# Patient Record
Sex: Female | Born: 1980 | Race: White | Hispanic: No | Marital: Single | Smoking: Former smoker
Health system: Southern US, Community
[De-identification: ages and names within clinical notes are randomized; demographics above are authoritative.]

## PROBLEM LIST (undated history)

## (undated) DIAGNOSIS — I10 Essential (primary) hypertension: Secondary | ICD-10-CM

## (undated) HISTORY — PX: OTHER SURGICAL HISTORY: SHX169

---

## 2014-10-25 ENCOUNTER — Emergency Department
Admission: EM | Admit: 2014-10-25 | Discharge: 2014-10-25 | Disposition: A | Discharge status: Home / Self Care | Payer: Self-pay | Source: Home / Self Care | Attending: Emergency Medicine | Admitting: Emergency Medicine

## 2014-10-25 DIAGNOSIS — J4521 Mild intermittent asthma with (acute) exacerbation: Secondary | ICD-10-CM

## 2014-10-25 DIAGNOSIS — J4520 Mild intermittent asthma, uncomplicated: Secondary | ICD-10-CM

## 2014-10-25 DIAGNOSIS — J0100 Acute maxillary sinusitis, unspecified: Secondary | ICD-10-CM

## 2014-10-25 HISTORY — DX: Essential (primary) hypertension: I10

## 2014-10-25 MED ORDER — PREDNISONE 50 MG PO TABS: 50.0000 mg | ORAL_TABLET | Freq: Every day | ORAL | Status: DC

## 2014-10-25 MED ORDER — AZITHROMYCIN 250 MG PO TABS: ORAL_TABLET | ORAL | Status: DC

## 2014-10-25 MED ORDER — ALBUTEROL SULFATE HFA 108 (90 BASE) MCG/ACT IN AERS: 2.0000 | INHALATION_SPRAY | RESPIRATORY_TRACT | Status: DC | PRN

## 2014-10-25 MED ORDER — FLUTICASONE PROPIONATE 50 MCG/ACT NA SUSP: NASAL | Status: DC

## 2014-10-25 NOTE — ED Provider Notes (Signed)
CSN: [id number]     Arrival date & time 10/25/14  1812 History   First MD Initiated Contact with Patient 10/25/14 1841     Chief Complaint  Patient presents with  . Cough  . Nasal Congestion  . Headache   (Consider location/radiation/quality/duration/timing/severity/associated sxs/prior Treatment) HPI Jamie Larson complains of fevers, chills, cough, sinus headaches, sore throat, wheezing and chest and nasal congestion for 3 days. Denies nausea or vomiting. History of occasional asthma flareup. She quit smoking 4 years ago. Past Medical History  Diagnosis Date  . Hypertension    History reviewed. No pertinent past surgical history. History reviewed. No pertinent family history. History  Substance Use Topics  . Smoking status: Former Smoker -- 0.50 packs/day for 4 years    Types: Cigarettes  . Smokeless tobacco: Never Used  . Alcohol Use: No   OB History    No data available     Review of Systems Remainder of Review of Systems negative for acute change except as noted in the HPI. She denies chance of pregnancy Allergies  Penicillins  Home Medications   Prior to Admission medications   Medication Sig Start Date End Date Taking? Authorizing Provider  loratadine (CLARITIN) 10 MG tablet Take 10 mg by mouth daily.   Yes Historical Provider, MD  albuterol (PROVENTIL HFA;VENTOLIN HFA) 108 (90 BASE) MCG/ACT inhaler Inhale 2 puffs into the lungs every 4 (four) hours as needed for wheezing or shortness of breath. 10/25/14   Lajean Manes, MD  azithromycin (ZITHROMAX Z-PAK) 250 MG tablet Take 2 tablets on day one, then 1 tablet daily on days 2 through 5 10/25/14   Lajean Manes, MD  fluticasone [facility]) 50 MCG/ACT nasal spray 1 or 2 sprays in each nostril twice a day 10/25/14   Lajean Manes, MD  predniSONE (DELTASONE) 50 MG tablet Take 1 tablet (50 mg total) by mouth daily with breakfast. For 5 days. 10/25/14   Lajean Manes, MD   BP 148/100 mmHg  Pulse 69  Temp(Src) 99.1 F (37.3 C) (Oral)   Ht 5' (1.524 m)  Wt 119 lb (53.978 kg)  BMI 23.24 kg/m2  SpO2 96% Physical Exam  Constitutional: She is oriented to person, place, and time. She appears well-developed and well-nourished. No distress.  HENT:  Head: Normocephalic and atraumatic.  Right Ear: Tympanic membrane, external ear and ear canal normal.  Left Ear: Tympanic membrane, external ear and ear canal normal.  Nose: Mucosal edema and rhinorrhea present. Right sinus exhibits maxillary sinus tenderness. Left sinus exhibits maxillary sinus tenderness.  Mouth/Throat: Oropharynx is clear and moist. No oral lesions. No oropharyngeal exudate.  Eyes: Right eye exhibits no discharge. Left eye exhibits no discharge. No scleral icterus.  Neck: Neck supple.  Cardiovascular: Normal rate, regular rhythm and normal heart sounds.   Pulmonary/Chest: Effort normal. She has wheezes (rare late expiratory). She has rhonchi. She has no rales.  Lymphadenopathy:    She has no cervical adenopathy.  Neurological: She is alert and oriented to person, place, and time.  Skin: Skin is warm and dry. No rash noted.  Psychiatric: She has a normal mood and affect.  Nursing note and vitals reviewed.   ED Course  Procedures (including critical care time) Labs Review Labs Reviewed - No data to display  Imaging Review No results found. She declined any imaging tests today.  MDM   1. Acute maxillary sinusitis, recurrence not specified   2. Asthma, mild intermittent, uncomplicated   3. Asthma with acute exacerbation, mild intermittent  Treatment options discussed, as well as risks, benefits, alternatives. She declined DuoNeb treatment. She declined any IM treatment. Patient voiced understanding and agreement with the following plans:   New Prescriptions   ALBUTEROL (PROVENTIL HFA;VENTOLIN HFA) 108 (90 BASE) MCG/ACT INHALER    Inhale 2 puffs into the lungs every 4 (four) hours as needed for wheezing or shortness of breath.   AZITHROMYCIN  (ZITHROMAX Z-PAK) 250 MG TABLET    Take 2 tablets on day one, then 1 tablet daily on days 2 through 5   FLUTICASONE (FLONASE) 50 MCG/ACT NASAL SPRAY    1 or 2 sprays in each nostril twice a day   PREDNISONE (DELTASONE) 50 MG TABLET    Take 1 tablet (50 mg total) by mouth daily with breakfast. For 5 days.  Follow-up with your primary care doctor in 5-7 days if not improving, or sooner if symptoms become worse. Precautions discussed. Red flags discussed. Questions invited and answered. Patient voiced understanding and agreement.     Lajean Manes, MD 10/25/14 [phone]

## 2014-10-25 NOTE — ED Notes (Signed)
Jamie Larson complains of fevers, chills, cough, eye drainage, headaches, sore throat, wheezing and congestion for 3 days.

## 2016-04-06 ENCOUNTER — Encounter: Payer: Self-pay | Admitting: Emergency Medicine

## 2016-04-06 ENCOUNTER — Emergency Department (INDEPENDENT_AMBULATORY_CARE_PROVIDER_SITE_OTHER)
Admission: EM | Admit: 2016-04-06 | Discharge: 2016-04-06 | Disposition: A | Discharge status: Home / Self Care | Payer: Medicaid Other | Source: Home / Self Care | Attending: Family Medicine | Admitting: Family Medicine

## 2016-04-06 DIAGNOSIS — I10 Essential (primary) hypertension: Secondary | ICD-10-CM

## 2016-04-06 MED ORDER — LISINOPRIL 10 MG PO TABS: 10.0000 mg | ORAL_TABLET | Freq: Every day | ORAL | 0 refills | Status: AC

## 2016-04-06 NOTE — ED Provider Notes (Signed)
[facility]    CSN: [id number] Arrival date & time: 04/06/16  1054     History   Chief Complaint Chief Complaint  Patient presents with  . Hypertension    HPI Jamie Larson is a 35 y.o. female.   Patient complains of 3 day history of band-like frontal headache, intermittent tightness in her chest, and occasional tingling in her arms.  She reports that she has been having similar headaches intermittently for 3 to 4 months and occasional blurred vision.  She measured her BP yesterday at 200/118.  She took a labetalol 100mg  tab (from a relative) and felt better. She continues to smoke 1/2 pack per day.  Family history of CAD and hypertension in maternal and paternal relatives.  Maternal grandfather with DM.   The history is provided by the patient.    Past Medical History:  Diagnosis Date  . Hypertension     There are no active problems to display for this patient.   Past Surgical History:  Procedure Laterality Date  . ear     tubes in ear as a child    OB History    No data available       Home Medications    Prior to Admission medications   Medication Sig Start Date End Date Taking? Authorizing Provider  lisinopril (PRINIVIL,ZESTRIL) 10 MG tablet Take 1 tablet (10 mg total) by mouth daily. for blood pressure 04/06/16   Lattie HawStephen A Almena Hokenson, MD    Family History No family history on file.  Social History Social History  Substance Use Topics  . Smoking status: Former Smoker    Packs/day: 0.50    Years: 4.00    Types: Cigarettes  . Smokeless tobacco: Never Used  . Alcohol use No     Allergies   Penicillins   Review of Systems Review of Systems  Constitutional: Negative for activity change, appetite change, chills, diaphoresis, fatigue, fever and unexpected weight change.  HENT: Negative.   Eyes: Positive for visual disturbance.  Respiratory: Negative.   Cardiovascular: Negative for leg swelling.       Intermittent chest tightness    Gastrointestinal: Negative.   Endocrine: Negative.   Genitourinary: Negative.   Musculoskeletal: Negative.   Skin: Negative.   Neurological: Positive for headaches. Negative for tremors, syncope, facial asymmetry, speech difficulty, weakness, light-headedness and numbness.     Physical Exam Triage Vital Signs ED Triage Vitals  Enc Vitals Group     BP 04/06/16 1141 (!) 189/127     Pulse Rate 04/06/16 1141 69     Resp --      Temp 04/06/16 1141 98.8 F (37.1 C)     Temp Source 04/06/16 1141 Oral     SpO2 04/06/16 1141 100 %     Weight 04/06/16 1141 131 lb 8 oz (59.6 kg)     Height 04/06/16 1141 5' (1.524 m)     Head Circumference --      Peak Flow --      Pain Score 04/06/16 1144 0     Pain Loc --      Pain Edu? --      Excl. in GC? --    No data found.   Updated Vital Signs BP (!) 189/127 (BP Location: Left Arm)   Pulse 69   Temp 98.8 F (37.1 C) (Oral)   Ht 5' (1.524 m)   Wt 131 lb 8 oz (59.6 kg)   LMP 03/30/2016 (Exact Date)   SpO2 100%  BMI 25.68 kg/m   Visual Acuity Right Eye Distance:   Left Eye Distance:   Bilateral Distance:    Right Eye Near:   Left Eye Near:    Bilateral Near:     Physical Exam Nursing notes and Vital Signs reviewed. Appearance:  Patient appears stated age, and in no acute distress Eyes:  Pupils are equal, round, and reactive to light and accomodation.  Extraocular movement is intact.  Conjunctivae are not inflamed.  Fundi benign. Ears:  Canals normal.  Tympanic membranes normal.  Nose:   Normal turbinates.  No sinus tenderness.     Pharynx:  Normal Neck:  Supple. No adenopathy.  Carotids have normal upstrokes without bruits.  Lungs:  Clear to auscultation.  Breath sounds are equal.  Moving air well. Heart:  Regular rate and rhythm without murmurs, rubs, or gallops.  Abdomen:  Nontender without masses or hepatosplenomegaly.  Bowel sounds are present.  No CVA or flank tenderness.  Extremities:  No edema.  Skin:  No rash  present.    UC Treatments / Results  Labs (all labs ordered are listed, but only abnormal results are displayed) Labs Reviewed - No data to display  EKG  EKG Interpretation  Rate:  67 BPM PR:  114 msec QT:  406 msec QTcH:  429 msec QRSD:  86 msec QRS axis:  76 degrees Interpretation:   Normal sinus rhythm; LVH; no acute changes       Radiology No results found.  Procedures Procedures (including critical care time)  Medications Ordered in UC Medications - No data to display   Initial Impression / Assessment and Plan / UC Course  I have reviewed the triage vital signs and the nursing notes.  Pertinent labs & imaging results that were available during my care of the patient were reviewed by me and considered in my medical decision making (see chart for details).  Clinical Course   Patient declines CMP and urinalysis. Begin lisinopril 10mg  daily Check blood pressure several times daily and record on a calendar.  If blood pressure has not improved in two days, increase lisinopril to two tabs daily. Recommend discontinuing smoking. If symptoms become significantly worse during the night or over the weekend, proceed to the local emergency room.  Followup with PCP in one week.    Final Clinical Impressions(s) / UC Diagnoses   Final diagnoses:  Essential hypertension    New Prescriptions New Prescriptions   LISINOPRIL (PRINIVIL,ZESTRIL) 10 MG TABLET    Take 1 tablet (10 mg total) by mouth daily. for blood pressure     Lattie HawStephen A Kendy Haston, MD 04/18/16 1230

## 2016-04-06 NOTE — Discharge Instructions (Signed)
Check blood pressure several times daily and record on a calendar.  If blood pressure has not improved in two days, increase lisinopril to two tabs daily. Recommend discontinuing smoking. If symptoms become significantly worse during the night or over the weekend, proceed to the local emergency room.

## 2016-04-06 NOTE — ED Triage Notes (Signed)
Pt c/o elevated blood pressure x 3 days, headaches and blurred vision.

## 2016-04-09 ENCOUNTER — Telehealth: Payer: Self-pay | Admitting: Emergency Medicine

## 2016-04-09 NOTE — Telephone Encounter (Signed)
Spoke with patient's mother and she reported patient had MD appointment today and is being thoroughly worked up and cared for.

## 2016-04-20 ENCOUNTER — Telehealth: Payer: Self-pay | Admitting: Emergency Medicine

## 2016-04-20 NOTE — Telephone Encounter (Signed)
Encounter for order of EKG

## 2017-04-27 ENCOUNTER — Emergency Department (INDEPENDENT_AMBULATORY_CARE_PROVIDER_SITE_OTHER)
Admission: EM | Admit: 2017-04-27 | Discharge: 2017-04-27 | Disposition: A | Discharge status: Home / Self Care | Payer: Medicaid Other | Source: Home / Self Care | Attending: Family Medicine | Admitting: Family Medicine

## 2017-04-27 ENCOUNTER — Other Ambulatory Visit: Payer: Self-pay

## 2017-04-27 ENCOUNTER — Emergency Department (INDEPENDENT_AMBULATORY_CARE_PROVIDER_SITE_OTHER): Payer: Medicaid Other

## 2017-04-27 ENCOUNTER — Encounter: Payer: Self-pay | Admitting: Emergency Medicine

## 2017-04-27 DIAGNOSIS — S022XXA Fracture of nasal bones, initial encounter for closed fracture: Secondary | ICD-10-CM

## 2017-04-27 DIAGNOSIS — R55 Syncope and collapse: Secondary | ICD-10-CM

## 2017-04-27 DIAGNOSIS — W19XXXA Unspecified fall, initial encounter: Secondary | ICD-10-CM

## 2017-04-27 DIAGNOSIS — S40212A Abrasion of left shoulder, initial encounter: Secondary | ICD-10-CM

## 2017-04-27 DIAGNOSIS — S50312A Abrasion of left elbow, initial encounter: Secondary | ICD-10-CM

## 2017-04-27 MED ORDER — TRAMADOL HCL 50 MG PO TABS: 50.0000 mg | ORAL_TABLET | Freq: Four times a day (QID) | ORAL | 0 refills | Status: AC | PRN

## 2017-04-27 NOTE — ED Provider Notes (Signed)
[facility]    CSN: [id number] Arrival date & time: 04/27/17  1617     History   Chief Complaint Chief Complaint  Patient presents with  . Loss of Consciousness  . Facial Pain    HPI Felicia Bloomquist is a 36 y.o. female.   HPI  Primrose Oler is a 36 y.o. female presenting to UC with c/o nose pain and swelling after passing out and falling on her kitchen floor 2 days ago.  She notes she felt real hot prior to passing out.  She has had similar episodes before when she gets hot but never fully passes out.  Her boyfriend was with her at the time.  She felt sore and tired when she woke up but did not want to go to emergency department to be evaluated that evening. She does have a hx of HTN and has been taking her BP medication as prescribed but questions if her BP was too low that evening because she took her medication later in the day than usual.  She has noticed her systolic BP being in the 120s, which it is normally in the 130s-140s for her so she did not take her BP medication today.  Denies dizziness or lightheadedness today. Denies hx of DM or heart problems. Denies chest pain or palpitations.  Her main concern today is her nose, she is concerned it may be broken.  No prior nose fracture.    Past Medical History:  Diagnosis Date  . Hypertension     There are no active problems to display for this patient.   Past Surgical History:  Procedure Laterality Date  . ear     tubes in ear as a child    OB History    No data available       Home Medications    Prior to Admission medications   Medication Sig Start Date End Date Taking? Authorizing Provider  hydrochlorothiazide (MICROZIDE) 12.5 MG capsule Take 12.5 mg by mouth daily.   Yes [provider]  metoprolol succinate (TOPROL-XL) 50 MG 24 hr tablet Take 50 mg by mouth daily. Take with or immediately following a meal.   Yes [provider]  lisinopril (PRINIVIL,ZESTRIL) 10 MG tablet Take 1  tablet (10 mg total) by mouth daily. for blood pressure 04/06/16   Lattie Haw, MD  traMADol (ULTRAM) 50 MG tablet Take 1 tablet (50 mg total) by mouth every 6 (six) hours as needed. 04/27/17   Lurene Shadow, PA-C    Family History No family history on file.  Social History Social History   Tobacco Use  . Smoking status: Former Smoker    Packs/day: 0.50    Years: 4.00    Pack years: 2.00    Types: Cigarettes  . Smokeless tobacco: Never Used  Substance Use Topics  . Alcohol use: No  . Drug use: No     Allergies   Lisinopril and Penicillins   Review of Systems Review of Systems  Constitutional: Negative for chills and fever.  HENT: Positive for facial swelling (nose) and nosebleeds (minimal initially after fall).   Respiratory: Negative for chest tightness, shortness of breath and wheezing.   Cardiovascular: Negative for chest pain, palpitations and leg swelling.  Gastrointestinal: Negative for diarrhea, nausea and vomiting.  Musculoskeletal: Negative for arthralgias and myalgias.  Skin: Positive for wound. Negative for color change.  Neurological: Positive for syncope and light-headedness. Negative for dizziness and headaches.     Physical Exam Triage  Vital Signs ED Triage Vitals  Enc Vitals Group     BP 04/27/17 1641 (!) 172/96     Pulse Rate 04/27/17 1641 64     Resp 04/27/17 1641 16     Temp 04/27/17 1641 98.5 F (36.9 C)     Temp Source 04/27/17 1641 Oral     SpO2 04/27/17 1641 99 %     Weight 04/27/17 1642 130 lb (59 kg)     Height 04/27/17 1642 5' (1.524 m)     Head Circumference --      Peak Flow --      Pain Score --      Pain Loc --      Pain Edu? --      Excl. in GC? --    No data found.  Updated Vital Signs BP (!) 156/96   Pulse 64   Temp 98.5 F (36.9 C) (Oral)   Resp 16   Ht 5' (1.524 m)   Wt 130 lb (59 kg)   LMP 04/22/2017 (Exact Date)   SpO2 99%   BMI 25.39 kg/m   Visual Acuity Right Eye Distance:   Left Eye Distance:     Bilateral Distance:    Right Eye Near:   Left Eye Near:    Bilateral Near:     Physical Exam  Constitutional: She is oriented to person, place, and time. She appears well-developed and well-nourished. No distress.  HENT:  Head: Normocephalic.  Right Ear: Tympanic membrane normal.  Left Ear: Tympanic membrane normal.  Nose: Sinus tenderness present. No nasal septal hematoma. Right sinus exhibits no maxillary sinus tenderness and no frontal sinus tenderness. Left sinus exhibits no maxillary sinus tenderness and no frontal sinus tenderness.    Mouth/Throat: Uvula is midline, oropharynx is clear and moist and mucous membranes are normal. No trismus in the jaw.  Mild to moderate edema of bridge of nose. Tender. Appears well aligned.   Eyes: Conjunctivae and EOM are normal. Pupils are equal, round, and reactive to light. Right eye exhibits no discharge. Left eye exhibits no discharge.  Neck: Normal range of motion. Neck supple.  No midline bone tenderness, no crepitus or step-offs.   Cardiovascular: Normal rate and regular rhythm.  Pulmonary/Chest: Effort normal and breath sounds normal. No stridor. No respiratory distress. She has no wheezes. She has no rales.  Musculoskeletal: Normal range of motion.  No midline spinal tenderness. Full ROM upper and lower extremities bilaterally.   Neurological: She is alert and oriented to person, place, and time.  Skin: Skin is warm and dry. Capillary refill takes less than 2 seconds. She is not diaphoretic.  Left shoulder and elbow/proximal forearm: superficial abrasions. No erythema, bleeding or discharge.   Psychiatric: She has a normal mood and affect. Her behavior is normal.  Nursing note and vitals reviewed.    UC Treatments / Results  Labs (all labs ordered are listed, but only abnormal results are displayed) Labs Reviewed - No data to display  EKG  EKG Interpretation None       Radiology Dg Nasal Bones  Result Date:  04/27/2017 CLINICAL DATA:  Nasal pain and swelling, post fall. EXAM: NASAL BONES - 3+ VIEW COMPARISON:  None. FINDINGS: Mildly comminuted nasal bone fracture at the tip of the osseous portion of the nose, extending bilaterally. Bowing of the nasal septum to the left with uncertain acuity. Mild soft tissue swelling of the nose. Paranasal sinuses are normally aerated. IMPRESSION: Mildly comminuted bilateral nasal bone fracture  at the tip of the osseous portion of the nose. Bowing of the nasal septum to the left with uncertain acuity. Electronically Signed   By: Ted Mcalpineobrinka  Dimitrova M.D.   On: 04/27/2017 17:24    Procedures Procedures (including critical care time)  Medications Ordered in UC Medications - No data to display   Initial Impression / Assessment and Plan / UC Course  I have reviewed the triage vital signs and the nursing notes.  Pertinent labs & imaging results that were available during my care of the patient were reviewed by me and considered in my medical decision making (see chart for details).     Hx and exam c/w closed nasal bone fracture  Syncopal episode occurred 2 days ago. Normal neuro exam today. Vitals: unremarkable, BP elevated c/w pt not taking her BP medication. Encouraged to start taking again.  Encouraged f.u with PCP for syncope and BP management. F/u with ENT for further evaluation and treatment of nasal bone fracture Home care instructions provided  Discussed symptoms that warrant emergent care in the ED.   Final Clinical Impressions(s) / UC Diagnoses   Final diagnoses:  Syncope and collapse  Closed fracture of nasal bone, initial encounter  Abrasion of left elbow, initial encounter  Abrasion of left shoulder area, initial encounter    ED Discharge Orders        Ordered    traMADol (ULTRAM) 50 MG tablet  Every 6 hours PRN     04/27/17 1734       Controlled Substance Prescriptions [place] Controlled Substance Registry consulted? Not Applicable    Rolla Platehelps, Jacquiline Zurcher O, PA-C 04/28/17 1710

## 2017-04-27 NOTE — ED Triage Notes (Signed)
Patient fainted while cooking supper 2 nights ago. Had taken antihypertensive rxs later than usual that day. Since then has had lower than usual BPs. Did lacerate nose when she fell. No other episodes since.

## 2017-04-27 NOTE — Discharge Instructions (Signed)
°  You may take 500mg acetaminophen every 4-6 hours or in combination with ibuprofen 400-600mg every 6-8 hours as needed for pain and inflammation. ° °Tramadol is strong pain medication. While taking, do not drink alcohol, drive, or perform any other activities that requires focus while taking these medications.  ° ° °

## 2018-12-11 IMAGING — DX DG NASAL BONES 3+V
3 series · 3 of 3 positions shown · non-contrast
Comparison: None.

CLINICAL DATA: Nasal pain and swelling, post fall.

EXAM:
NASAL BONES - 3+ VIEW

[nasal waters]
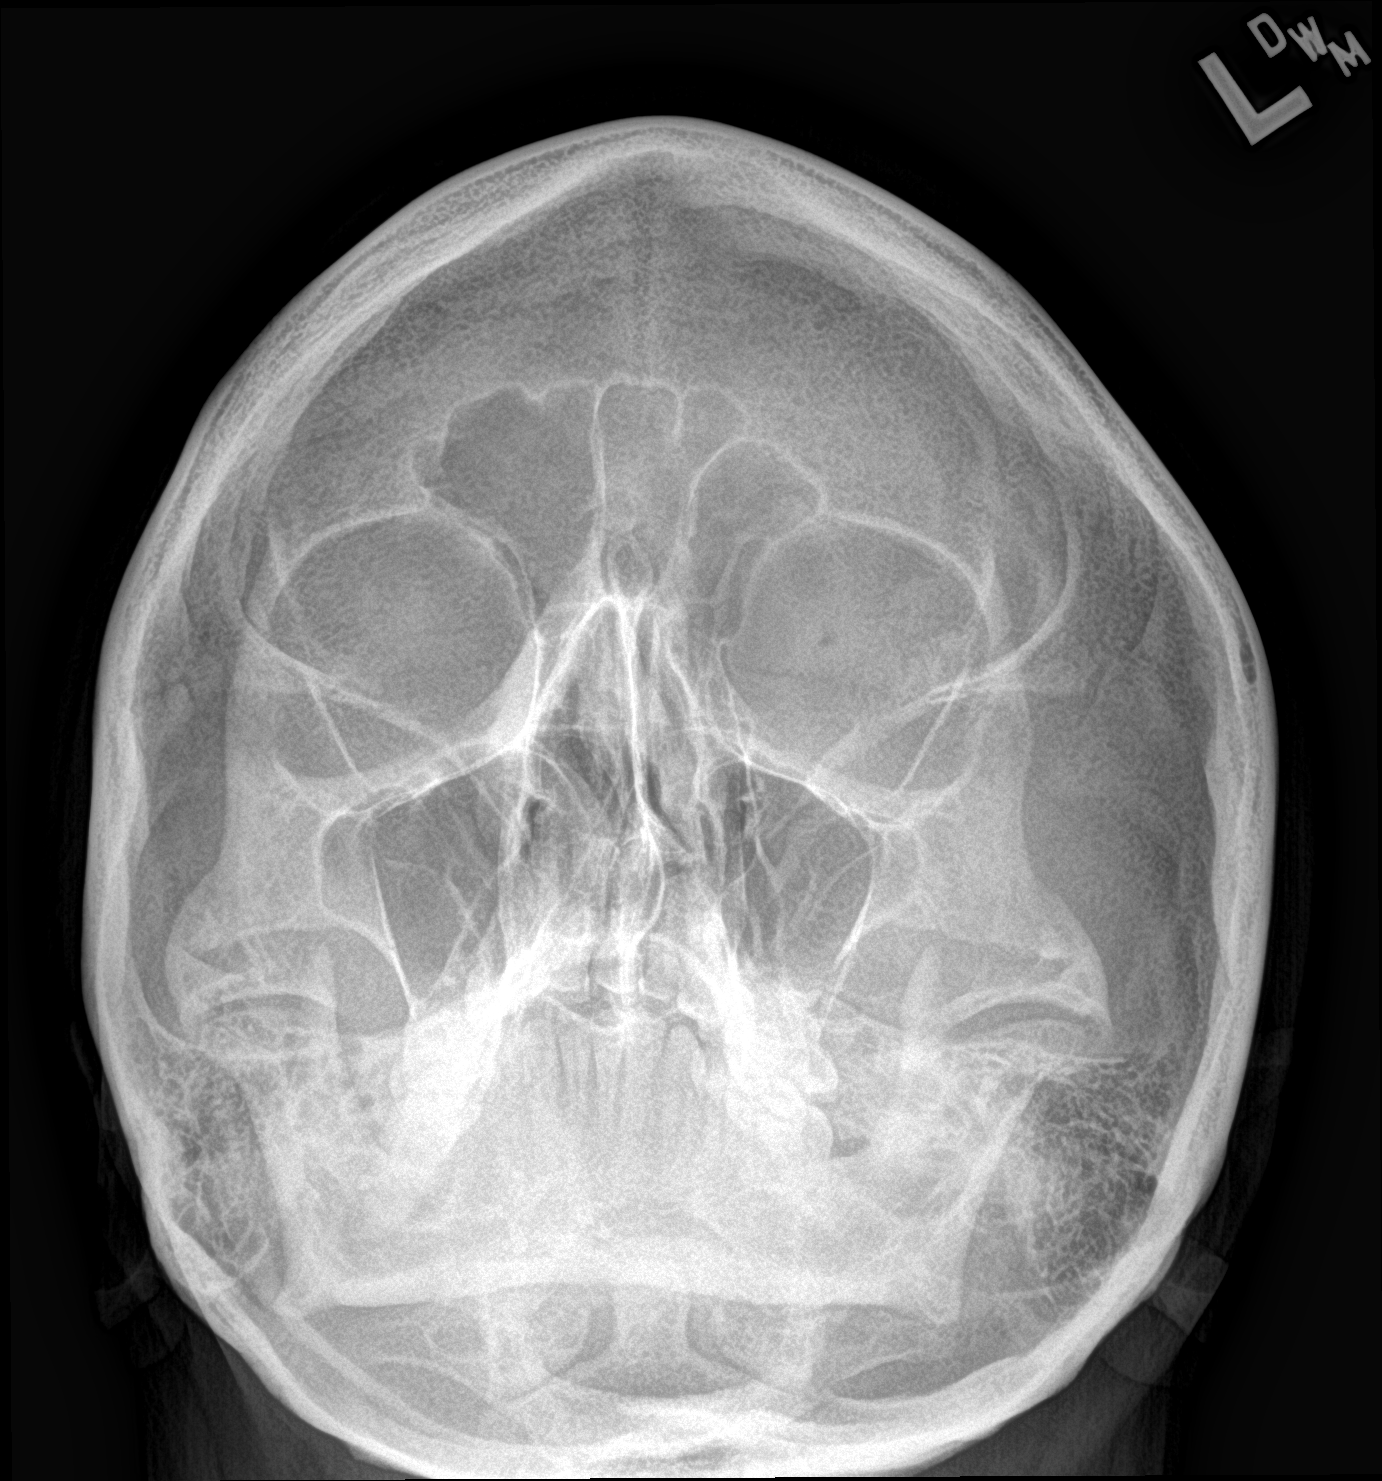

[nasal lat (1 of 2)]
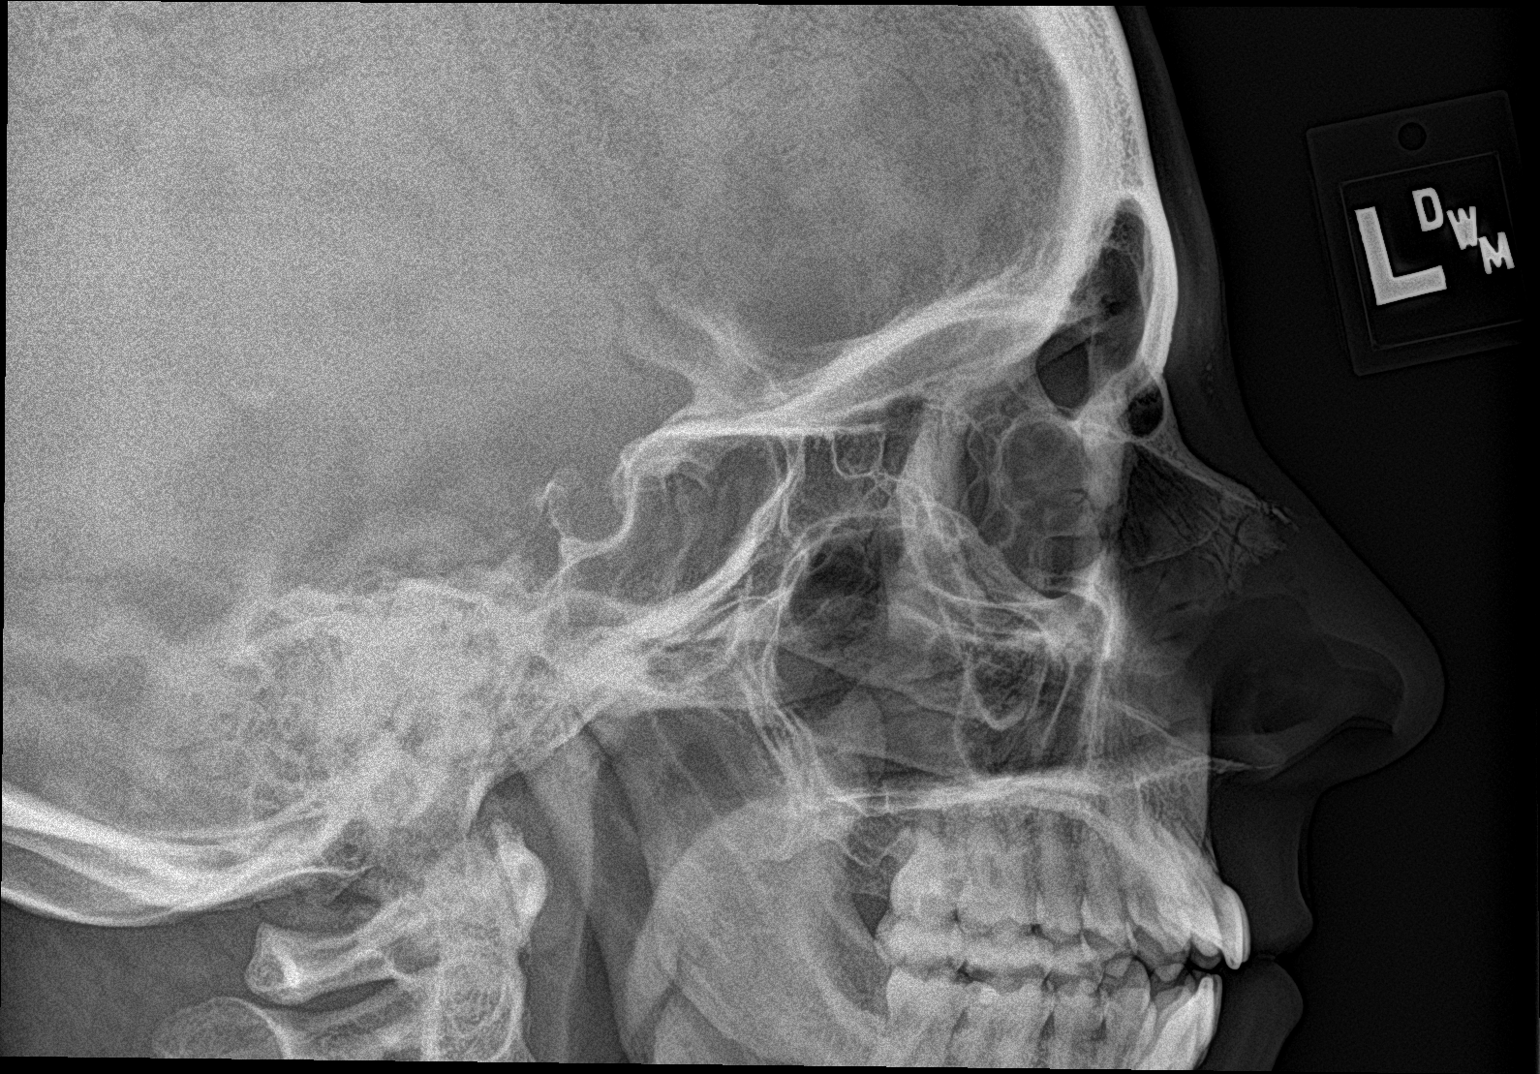

[nasal lat (2 of 2)]
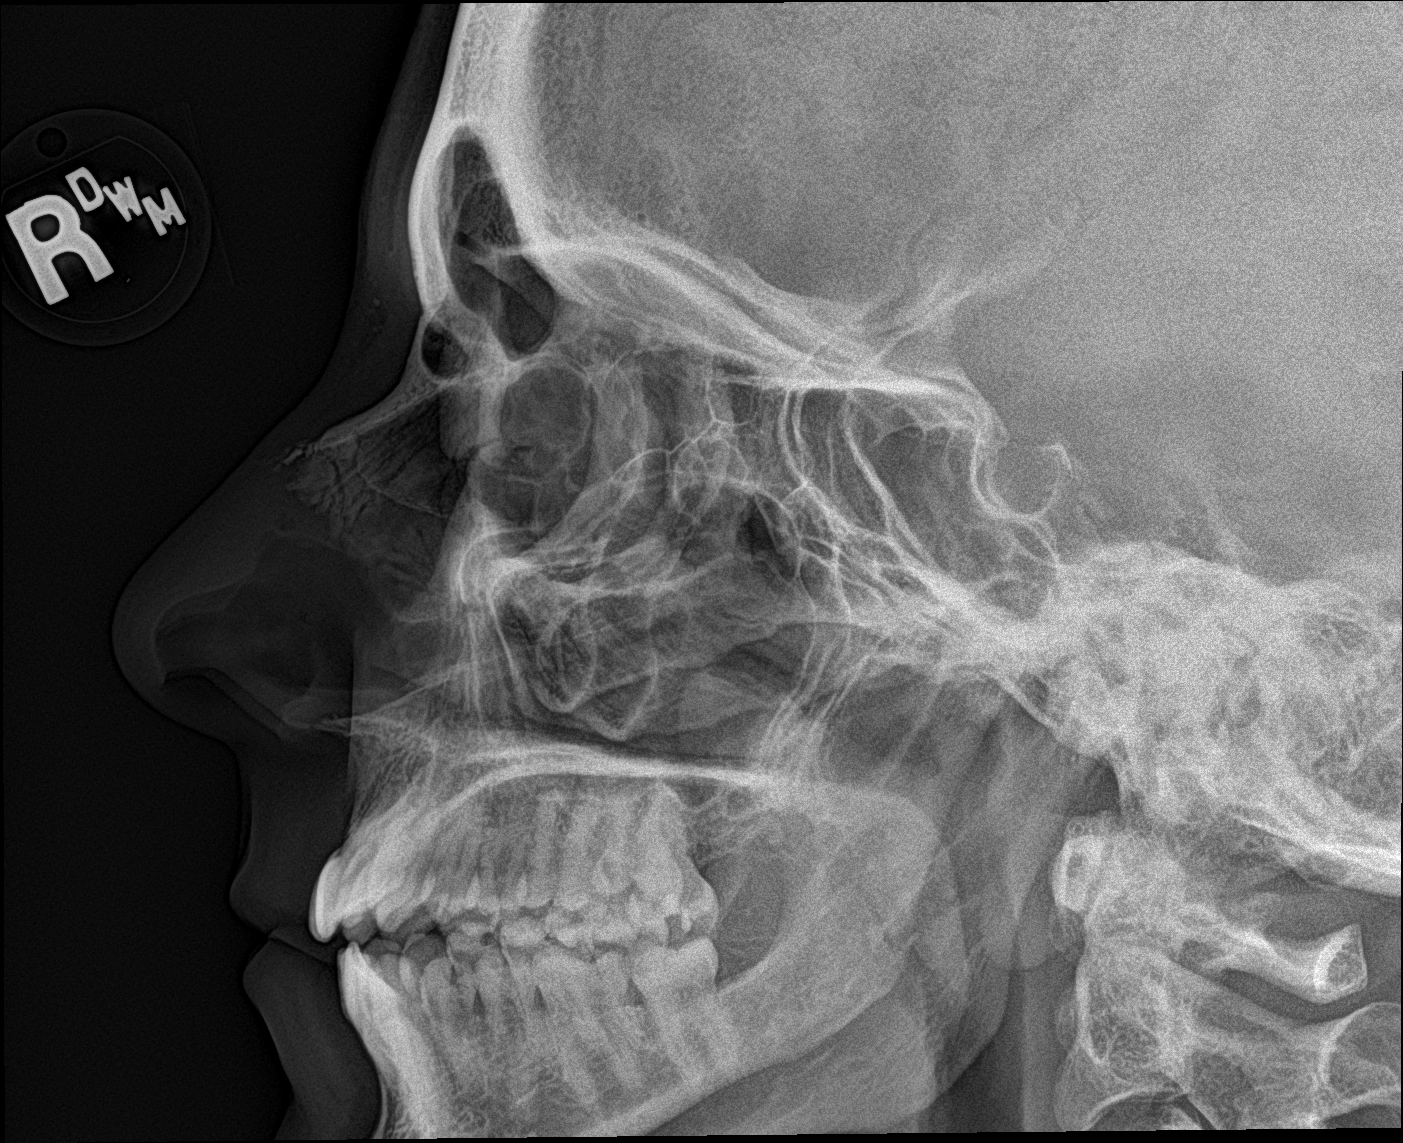

[3 of 3 positions shown; findings below may reference images not displayed]

FINDINGS: Mildly comminuted nasal bone fracture at the tip of the osseous
portion of the nose, extending bilaterally. Bowing of the nasal
septum to the left with uncertain acuity. Mild soft tissue swelling
of the nose.

Paranasal sinuses are normally aerated.
IMPRESSION: Mildly comminuted bilateral nasal bone fracture at the tip of the
osseous portion of the nose.

Bowing of the nasal septum to the left with uncertain acuity.
